# Patient Record
Sex: Male | Born: 1985 | Hispanic: Yes | Marital: Single | Smoking: Never smoker
Health system: Southern US, Community
[De-identification: ages and names within clinical notes are randomized; demographics above are authoritative.]

---

## 2014-05-15 ENCOUNTER — Emergency Department (HOSPITAL_BASED_OUTPATIENT_CLINIC_OR_DEPARTMENT_OTHER): Payer: Self-pay

## 2014-05-15 ENCOUNTER — Encounter (HOSPITAL_BASED_OUTPATIENT_CLINIC_OR_DEPARTMENT_OTHER): Payer: Self-pay | Admitting: Emergency Medicine

## 2014-05-15 ENCOUNTER — Emergency Department (HOSPITAL_BASED_OUTPATIENT_CLINIC_OR_DEPARTMENT_OTHER)
Admission: EM | Admit: 2014-05-15 | Discharge: 2014-05-15 | Disposition: A | Discharge status: Home / Self Care | Payer: Self-pay | Attending: Emergency Medicine | Admitting: Emergency Medicine

## 2014-05-15 DIAGNOSIS — R52 Pain, unspecified: Secondary | ICD-10-CM

## 2014-05-15 DIAGNOSIS — M62830 Muscle spasm of back: Secondary | ICD-10-CM | POA: Insufficient documentation

## 2014-05-15 MED ORDER — METHOCARBAMOL 500 MG PO TABS
500.0000 mg | ORAL_TABLET | Freq: Two times a day (BID) | ORAL | Status: AC
Start: 2014-05-15 — End: ?

## 2014-05-15 MED ORDER — KETOROLAC TROMETHAMINE 60 MG/2ML IM SOLN
60.0000 mg | Freq: Once | INTRAMUSCULAR | Status: AC
  Administered 2014-05-15: 60 mg via INTRAMUSCULAR
  Filled 2014-05-15: qty 2

## 2014-05-15 MED ORDER — PREDNISONE 20 MG PO TABS: ORAL_TABLET | ORAL | Status: AC

## 2014-05-15 MED ORDER — IBUPROFEN 800 MG PO TABS: 800.0000 mg | ORAL_TABLET | Freq: Three times a day (TID) | ORAL | Status: AC

## 2014-05-15 MED ORDER — METHOCARBAMOL 500 MG PO TABS
1000.0000 mg | ORAL_TABLET | Freq: Once | ORAL | Status: AC
  Administered 2014-05-15: 1000 mg via ORAL
  Filled 2014-05-15: qty 2

## 2014-05-15 MED ORDER — DEXAMETHASONE SODIUM PHOSPHATE 10 MG/ML IJ SOLN: 10.0000 mg | Freq: Once | INTRAMUSCULAR | Status: DC

## 2014-05-15 MED ORDER — DEXAMETHASONE SODIUM PHOSPHATE 4 MG/ML IJ SOLN
8.0000 mg | Freq: Once | INTRAMUSCULAR | Status: AC
  Administered 2014-05-15: 8 mg via INTRAMUSCULAR
  Filled 2014-05-15: qty 2

## 2014-05-15 MED ORDER — TRAMADOL HCL 50 MG PO TABS: 50.0000 mg | ORAL_TABLET | Freq: Four times a day (QID) | ORAL | Status: AC | PRN

## 2014-05-15 NOTE — ED Notes (Signed)
Pt reports was lifting a box at work and injured back on Sunday.  Pt reports next day had difficulty bending.  Does not want workmans comp. Pt reports pain shoots down left leg and into buttock

## 2014-05-15 NOTE — ED Provider Notes (Signed)
CSN: [id number]     Arrival date & time 05/15/14  0103 History   First MD Initiated Contact with Patient 05/15/14 0134     Chief Complaint  Patient presents with  . Back Pain     (Consider location/radiation/quality/duration/timing/severity/associated sxs/prior Treatment) Patient is a 28 y.o. male presenting with back pain. The history is provided by the patient. No language interpreter was used.  Back Pain Location:  Sacro-iliac joint Quality:  Aching Radiates to:  Does not radiate Pain severity:  Moderate Pain is:  Same all the time Onset quality:  Sudden Timing:  Constant Progression:  Unchanged Chronicity:  New Context: lifting heavy objects   Relieved by:  Nothing Worsened by:  Nothing tried Ineffective treatments:  None tried Associated symptoms: no abdominal pain, no abdominal swelling, no bladder incontinence, no bowel incontinence, no chest pain, no dysuria, no fever, no headaches, no leg pain, no numbness, no paresthesias, no pelvic pain, no perianal numbness, no tingling, no weakness and no weight loss   Risk factors: no hx of cancer     History reviewed. No pertinent past medical history. History reviewed. No pertinent past surgical history. History reviewed. No pertinent family history. History  Substance Use Topics  . Smoking status: Never Smoker   . Smokeless tobacco: Not on file  . Alcohol Use: No    Review of Systems  Constitutional: Negative for fever and weight loss.  Cardiovascular: Negative for chest pain.  Gastrointestinal: Negative for abdominal pain and bowel incontinence.  Genitourinary: Negative for bladder incontinence, dysuria and pelvic pain.  Musculoskeletal: Positive for back pain.  Neurological: Negative for tingling, weakness, numbness, headaches and paresthesias.  All other systems reviewed and are negative.     Allergies  Review of patient's allergies indicates no known allergies.  Home Medications   Prior to Admission  medications   Not on File   BP 130/65  Pulse 68  Temp(Src) 98.3 F (36.8 C) (Oral)  Resp 18  Ht 5\' 11"  (1.803 m)  Wt 175 lb (79.379 kg)  BMI 24.42 kg/m2  SpO2 99% Physical Exam  Constitutional: He is oriented to person, place, and time. He appears well-developed and well-nourished. No distress.  HENT:  Head: Normocephalic and atraumatic.  Mouth/Throat: Oropharynx is clear and moist.  Eyes: Conjunctivae are normal. Pupils are equal, round, and reactive to light.  Neck: Normal range of motion. Neck supple.  Cardiovascular: Normal rate, regular rhythm and intact distal pulses.   Pulmonary/Chest: Effort normal and breath sounds normal. He has no wheezes. He has no rales.  Abdominal: Soft. Bowel sounds are normal. There is no tenderness. There is no rebound and no guarding.  Musculoskeletal: Normal range of motion. He exhibits no edema and no tenderness.  Neurological: He is alert and oriented to person, place, and time. He has normal reflexes. He exhibits normal muscle tone.  Skin: Skin is warm and dry.  Psychiatric: He has a normal mood and affect.    ED Course  Procedures (including critical care time) Labs Review Labs Reviewed - No data to display  Imaging Review No results found.   EKG Interpretation None      MDM   Final diagnoses:  Pain    Pain medication and muscle relaxers    Jasper Ruminski K Roemello Speyer-Rasch, MD 05/15/14 0222

## 2014-05-20 ENCOUNTER — Encounter (HOSPITAL_BASED_OUTPATIENT_CLINIC_OR_DEPARTMENT_OTHER): Payer: Self-pay | Admitting: *Deleted

## 2014-05-20 ENCOUNTER — Emergency Department (HOSPITAL_BASED_OUTPATIENT_CLINIC_OR_DEPARTMENT_OTHER)
Admission: EM | Admit: 2014-05-20 | Discharge: 2014-05-20 | Disposition: A | Discharge status: Home / Self Care | Payer: Self-pay | Attending: Emergency Medicine | Admitting: Emergency Medicine

## 2014-05-20 DIAGNOSIS — Z791 Long term (current) use of non-steroidal anti-inflammatories (NSAID): Secondary | ICD-10-CM | POA: Insufficient documentation

## 2014-05-20 DIAGNOSIS — Z428 Encounter for other plastic and reconstructive surgery following medical procedure or healed injury: Secondary | ICD-10-CM | POA: Insufficient documentation

## 2014-05-20 DIAGNOSIS — Z79899 Other long term (current) drug therapy: Secondary | ICD-10-CM | POA: Insufficient documentation

## 2014-05-20 DIAGNOSIS — Z09 Encounter for follow-up examination after completed treatment for conditions other than malignant neoplasm: Secondary | ICD-10-CM

## 2014-05-20 NOTE — Discharge Instructions (Signed)
Your may return to work without restrictions, return as needed for any problems

## 2014-05-20 NOTE — ED Provider Notes (Signed)
CSN: [id number]     Arrival date & time 05/20/14  2007 History   First MD Initiated Contact with Patient 05/20/14 2028     Chief Complaint  Patient presents with  . Follow-up     (Consider location/radiation/quality/duration/timing/severity/associated sxs/prior Treatment) The history is provided by the patient.    Greg Wise is a 28 y.o. male who presents to the ED for a note stating he can return to work. He was evaluated for back pain that resulted form an injury at work 05/11/14. Patient states he is ready to return to full work.   History reviewed. No pertinent past medical history. History reviewed. No pertinent past surgical history. History reviewed. No pertinent family history. History  Substance Use Topics  . Smoking status: Never Smoker   . Smokeless tobacco: Not on file  . Alcohol Use: No    Review of Systems Negative   Allergies  Review of patient's allergies indicates no known allergies.  Home Medications   Prior to Admission medications   Medication Sig Start Date End Date Taking? Authorizing Provider  ibuprofen (ADVIL,MOTRIN) 800 MG tablet Take 1 tablet (800 mg total) by mouth 3 (three) times daily. 05/15/14   April K Palumbo-Rasch, MD  methocarbamol (ROBAXIN) 500 MG tablet Take 1 tablet (500 mg total) by mouth 2 (two) times daily. 05/15/14   April K Palumbo-Rasch, MD  predniSONE (DELTASONE) 20 MG tablet 3 tabs po day one, then 2 po daily x 4 days 05/15/14   April K Palumbo-Rasch, MD  traMADol (ULTRAM) 50 MG tablet Take 1 tablet (50 mg total) by mouth every 6 (six) hours as needed. 05/15/14   April K Palumbo-Rasch, MD   BP 113/69 mmHg  Pulse 80  Temp(Src) 97.9 F (36.6 C) (Oral)  Resp 16  SpO2 100% Physical Exam  Constitutional: He is oriented to person, place, and time. He appears well-developed and well-nourished. No distress.  HENT:  Head: Normocephalic and atraumatic.  Eyes: EOM are normal.  Neck: Neck supple.  Cardiovascular: Normal  rate.   Pulmonary/Chest: Effort normal.  Musculoskeletal: Normal range of motion.  Full range of motion of back without pain or difficulty. Steady gait without foot drag.   Neurological: He is alert and oriented to person, place, and time. No cranial nerve deficit.  Skin: Skin is warm and dry.  Psychiatric: He has a normal mood and affect. His behavior is normal.  Nursing note and vitals reviewed.   ED Course  Procedures (including critical care time) Labs Review  MDM  28 y.o. male with resolved pain of lower back and request to return to work. Stable for return to work without restrictions.     [address], [place] 05/23/14 [id number]  Glynn OctaveStephen Rancour, MD 05/23/14 [phone]

## 2014-05-20 NOTE — ED Notes (Signed)
np at bedside

## 2014-05-20 NOTE — ED Notes (Signed)
Pt seen here 10/25 for back injury at work. Pt need  note to return to work

## 2016-07-05 IMAGING — CR DG LUMBAR SPINE COMPLETE 4+V
5 series · 5 of 5 positions shown · non-contrast
Comparison: None.

CLINICAL DATA: Back pain after lifting injury.  Initial encounter

EXAM:
LUMBAR SPINE - COMPLETE 4+ VIEW

[t l-spine a.p.]
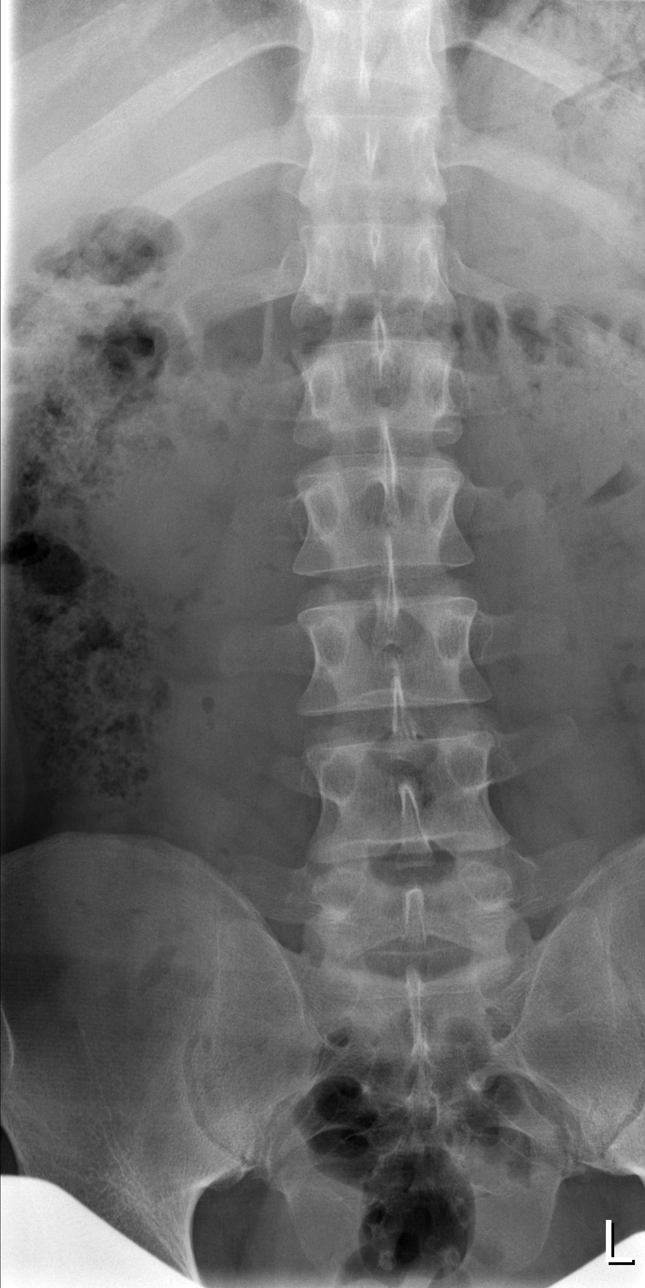

[t l-spine oblique exposure (1 of 2)]
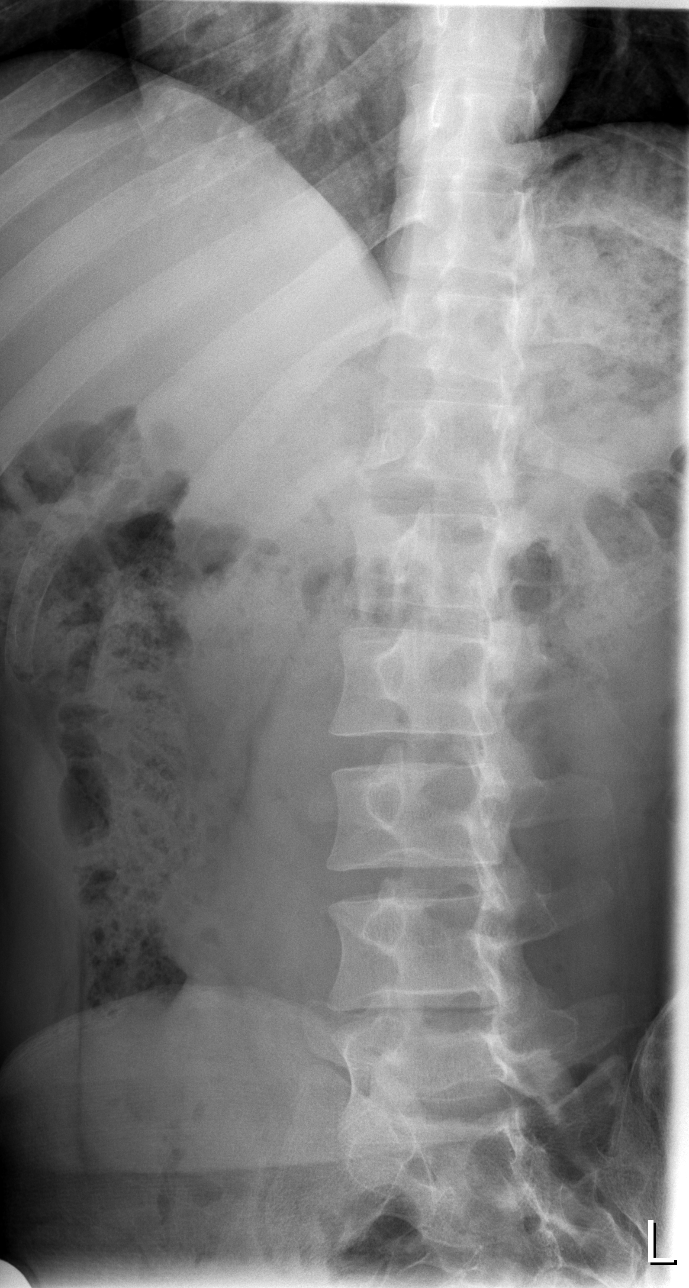

[t l-spine oblique exposure (2 of 2)]
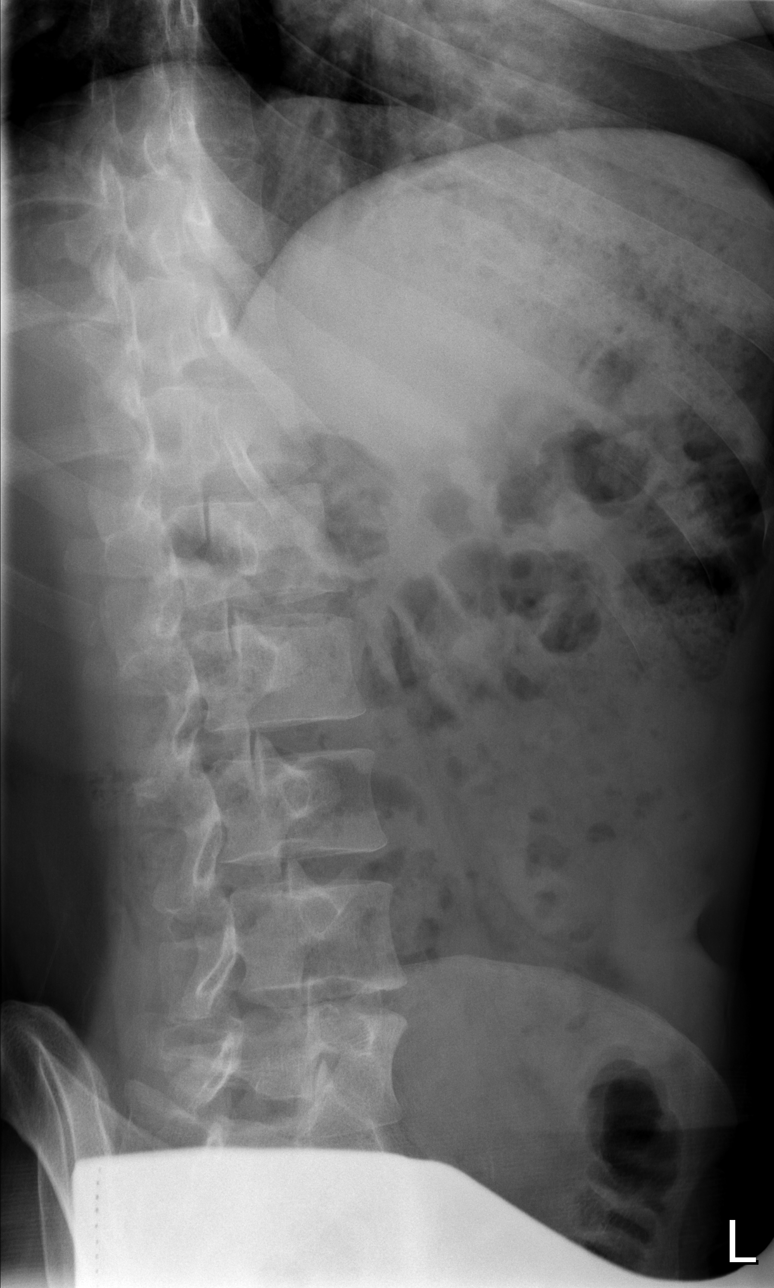

[t l-spine lat]
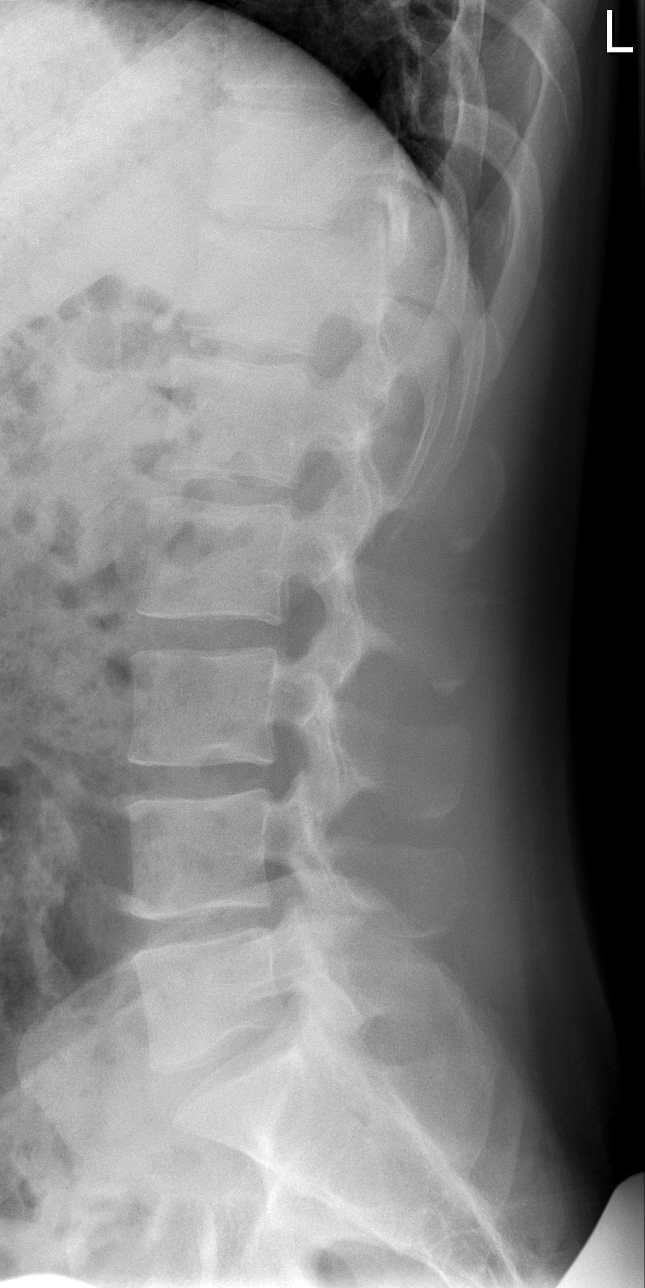

[t l-spine l5-s1 spot]
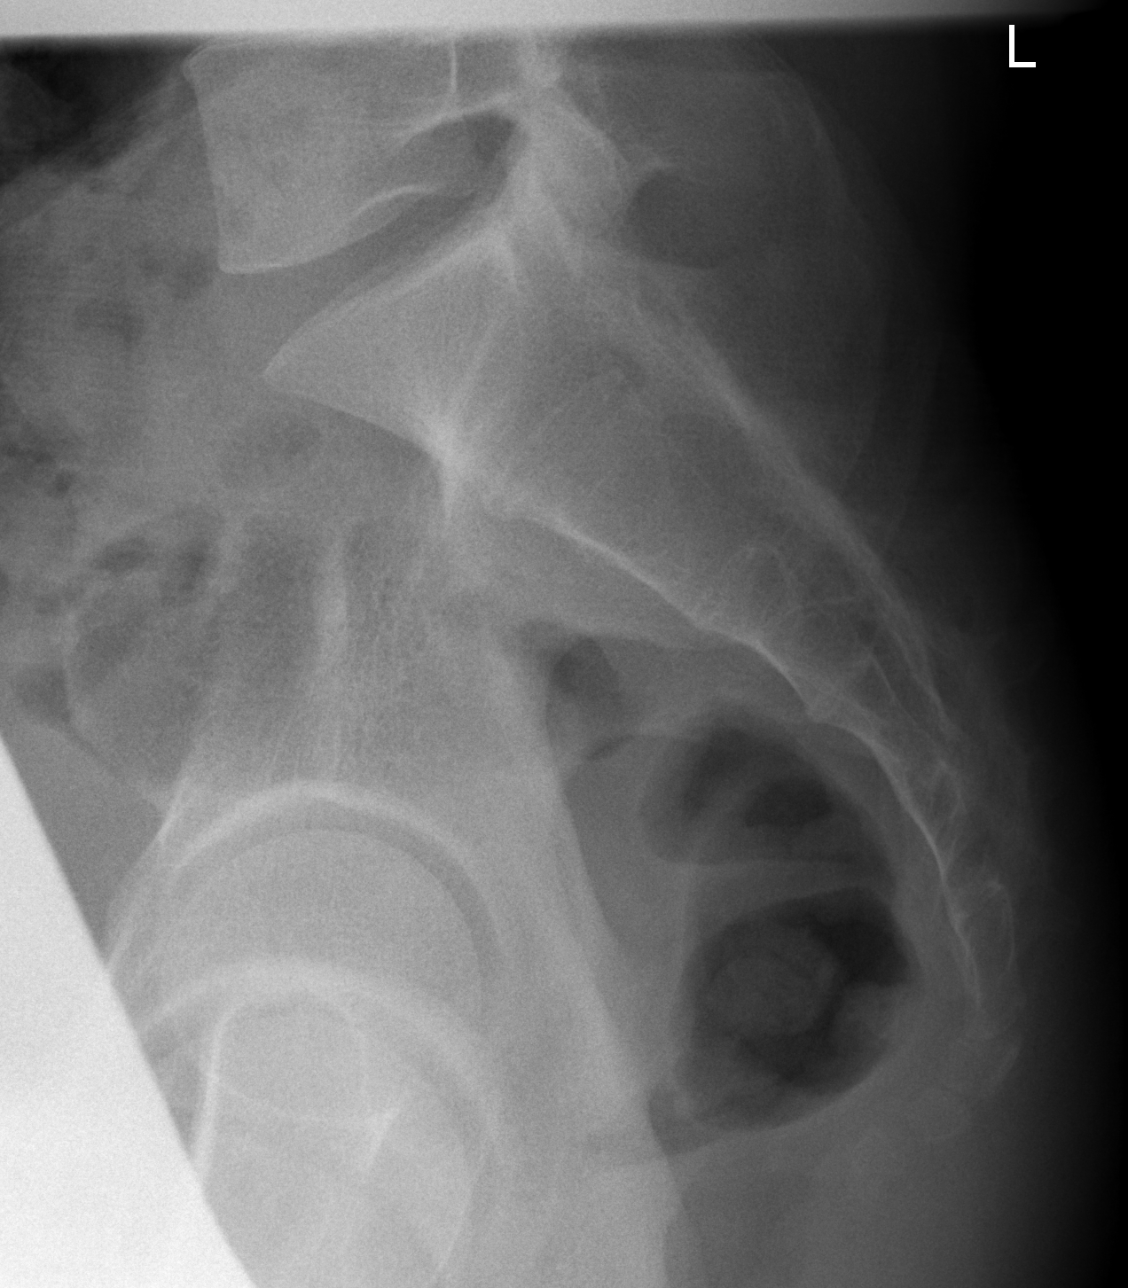

[5 of 5 positions shown; findings below may reference images not displayed]

FINDINGS: There is no evidence of lumbar spine fracture. No subluxation.
Minimal lower lumbar spondylotic change; intervertebral disc spaces
are maintained.
IMPRESSION: No evidence of osseous injury.
